# Patient Record
Sex: Male | Born: 1995 | Race: Black or African American | Hispanic: No | Marital: Single | Smoking: Never smoker
Health system: Southern US, Community
[De-identification: ages and names within clinical notes are randomized; demographics above are authoritative.]

---

## 2017-08-12 ENCOUNTER — Other Ambulatory Visit: Payer: Self-pay

## 2017-08-12 ENCOUNTER — Encounter (HOSPITAL_BASED_OUTPATIENT_CLINIC_OR_DEPARTMENT_OTHER): Payer: Self-pay | Admitting: *Deleted

## 2017-08-12 ENCOUNTER — Emergency Department (HOSPITAL_BASED_OUTPATIENT_CLINIC_OR_DEPARTMENT_OTHER)
Admission: EM | Admit: 2017-08-12 | Discharge: 2017-08-12 | Disposition: A | Discharge status: Home / Self Care | Payer: Managed Care, Other (non HMO) | Attending: Emergency Medicine | Admitting: Emergency Medicine

## 2017-08-12 ENCOUNTER — Emergency Department (HOSPITAL_BASED_OUTPATIENT_CLINIC_OR_DEPARTMENT_OTHER): Payer: Managed Care, Other (non HMO)

## 2017-08-12 DIAGNOSIS — M25572 Pain in left ankle and joints of left foot: Secondary | ICD-10-CM | POA: Diagnosis present

## 2017-08-12 MED ORDER — IBUPROFEN 800 MG PO TABS: 800.0000 mg | ORAL_TABLET | Freq: Three times a day (TID) | ORAL | 0 refills | Status: AC | PRN

## 2017-08-12 MED FILL — IBUPROFEN 800 MG TAB: 800 | 7 days supply | Qty: 21 | Fill #0

## 2017-08-12 NOTE — ED Provider Notes (Signed)
[facility] EMERGENCY DEPARTMENT Provider Note   CSN: [id number] Arrival date & time: 08/12/17  [id number]     History   Chief Complaint Chief Complaint  Patient presents with  . Ankle Pain    HPI Patrick Stark is a 22 y.o. male.  The history is provided by the patient.  Ankle Pain   The incident occurred 1 to 2 hours ago. The incident occurred at work. There was no injury mechanism. The pain is present in the left ankle. The quality of the pain is described as throbbing. The pain is at a severity of 2/10. The pain has been constant since onset. Pertinent negatives include no numbness, no inability to bear weight, no loss of motion, no muscle weakness, no loss of sensation and no tingling. The symptoms are aggravated by palpation and bearing weight. He has tried nothing for the symptoms. The treatment provided no relief.   22 year old male with prior history of left ankle fracture requiring surgery in 2014.  He states today he was just walking at work and he had acute pain in the lateral ankle.  There was no twist or fall.  He states this is a new problem and he has not had any difficulties with his ankle since the surgery.  Is not associated with any numbness or tingling or weakness.  It is worsened by ambulation and palpation of the area.  History reviewed. No pertinent past medical history.  There are no active problems to display for this patient.   History reviewed. No pertinent surgical history.     Home Medications    Prior to Admission medications   Not on File    Family History History reviewed. No pertinent family history.  Social History Social History   Tobacco Use  . Smoking status: Never Smoker  . Smokeless tobacco: Never Used  Substance Use Topics  . Alcohol use: Not on file  . Drug use: Not on file     Allergies   Patient has no known allergies.   Review of Systems Review of Systems  Constitutional: Negative for fever.  HENT:  Negative for sore throat.   Respiratory: Negative for shortness of breath.   Cardiovascular: Negative for chest pain.  Gastrointestinal: Negative for abdominal pain.  Genitourinary: Negative for dysuria.  Musculoskeletal: Positive for arthralgias (left ankle). Negative for back pain.  Skin: Negative for rash.  Neurological: Negative for tingling and numbness.     Physical Exam Updated Vital Signs BP 132/75 (BP Location: Left Arm)   Pulse 82   Temp 98.1 F (36.7 C) (Oral)   Resp 16   Ht 6\' 2"  (1.88 m)   Wt 72.6 kg (160 lb)   SpO2 100%   BMI 20.54 kg/m   Physical Exam  Constitutional: He appears well-developed and well-nourished.  HENT:  Head: Normocephalic and atraumatic.  Eyes: Conjunctivae are normal.  Neck: Neck supple.  Pulmonary/Chest: Effort normal.  Musculoskeletal:       Left knee: Normal.       Left ankle: He exhibits no swelling, no ecchymosis, no deformity and normal pulse. Tenderness. Lateral malleolus tenderness found. No medial malleolus, no head of 5th metatarsal and no proximal fibula tenderness found. Achilles tendon exhibits no pain and no defect.       Feet:  Neurological: He is alert. GCS eye subscore is 4. GCS verbal subscore is 5. GCS motor subscore is 6.  Skin: Skin is warm and dry.  Psychiatric: He has a normal mood and  affect.  Nursing note and vitals reviewed.    ED Treatments / Results  Labs (all labs ordered are listed, but only abnormal results are displayed) Labs Reviewed - No data to display  EKG  EKG Interpretation None       Radiology Dg Ankle Complete Left  Result Date: 08/12/2017 CLINICAL DATA:  Left lateral ankle pain starting this morning. Prior ankle surgery 4 years prior to imaging. EXAM: LEFT ANKLE COMPLETE - 3+ VIEW COMPARISON:  None. FINDINGS: Lateral plate and screw fixator of the distal fibula. Plafond and talar dome appear intact. No complicating feature regarding the fixation hardware. Flattening of the  longitudinal arch of the foot on the lateral projection, which was not obtained standing. No significant degree of soft tissue swelling is appreciated. Suspected small tibiotalar joint effusion. IMPRESSION: 1. Flattening of the longitudinal arch of the foot may be projectional or might reflect pes planus. Today's exam was not obtained with the patient standing. 2. No complicating feature related to the plate and screw fixators. 3. Suspected small tibiotalar joint effusion without appreciable lesion along the plafond or talar dome. Electronically Signed   By: Gaylyn Rong M.D.   On: 08/12/2017 08:58    Procedures Procedures (including critical care time)  Medications Ordered in ED Medications - No data to display   Initial Impression / Assessment and Plan / ED Course  I have reviewed the triage vital signs and the nursing notes.  Pertinent labs & imaging results that were available during my care of the patient were reviewed by me and considered in my medical decision making (see chart for details).     Differential diagnosis includes ankle sprain, ankle arthralgia secondary to loose foreign body, hardware failure.  By x-ray the hardware appears intact and there is no acute fracture.  Radiologist does comment upon a small effusion and a flattened longitudinal arch of the foot.  Feel the longitudinal arch flattening is not the source of the patient's discomfort today.  He likely had a loose body that somehow affected the joint and he is feeling the inflammatory pain since then.  We will treat him conservatively with anti-inflammatories.   Final Clinical Impressions(s) / ED Diagnoses   Final diagnoses:  Acute left ankle pain    ED Discharge Orders    None       Terrilee Files, MD 08/13/17 1221

## 2017-08-12 NOTE — ED Triage Notes (Signed)
Pt reports working 3am until 7am when he had a sudden onset of left ankle pain, denies injury or trauma, reports pain 1/10, increases to 2/10 with wb. Pt is watching tv, this rn has to ask him twice to turn off tv until after triage assessment complete. Pt states he has had surgery on this ankle. Socks removed, no swelling or redness noted. Scar to left outer ankle noted from previous surgery, pt is unsure if he has hardware.

## 2017-08-12 NOTE — Discharge Instructions (Addendum)
You were evaluated in the emergency department for left ankle pain.  Your x-ray did not show any acute fracture and your hardware was still in the correct position.  We are prescribing you ibuprofen to help with the inflammation and an ankle brace.  You should continue to ice the affected area.

## 2017-08-12 NOTE — ED Notes (Signed)
Patient transported to X-ray 

## 2019-08-15 IMAGING — CR DG ANKLE COMPLETE 3+V*L*
3 series · 3 of 3 positions shown · non-contrast
Comparison: None.

CLINICAL DATA: Left lateral ankle pain starting this morning. Prior
ankle surgery 4 years prior to imaging.

EXAM:
LEFT ANKLE COMPLETE - 3+ VIEW

[t ankle joint ap left]
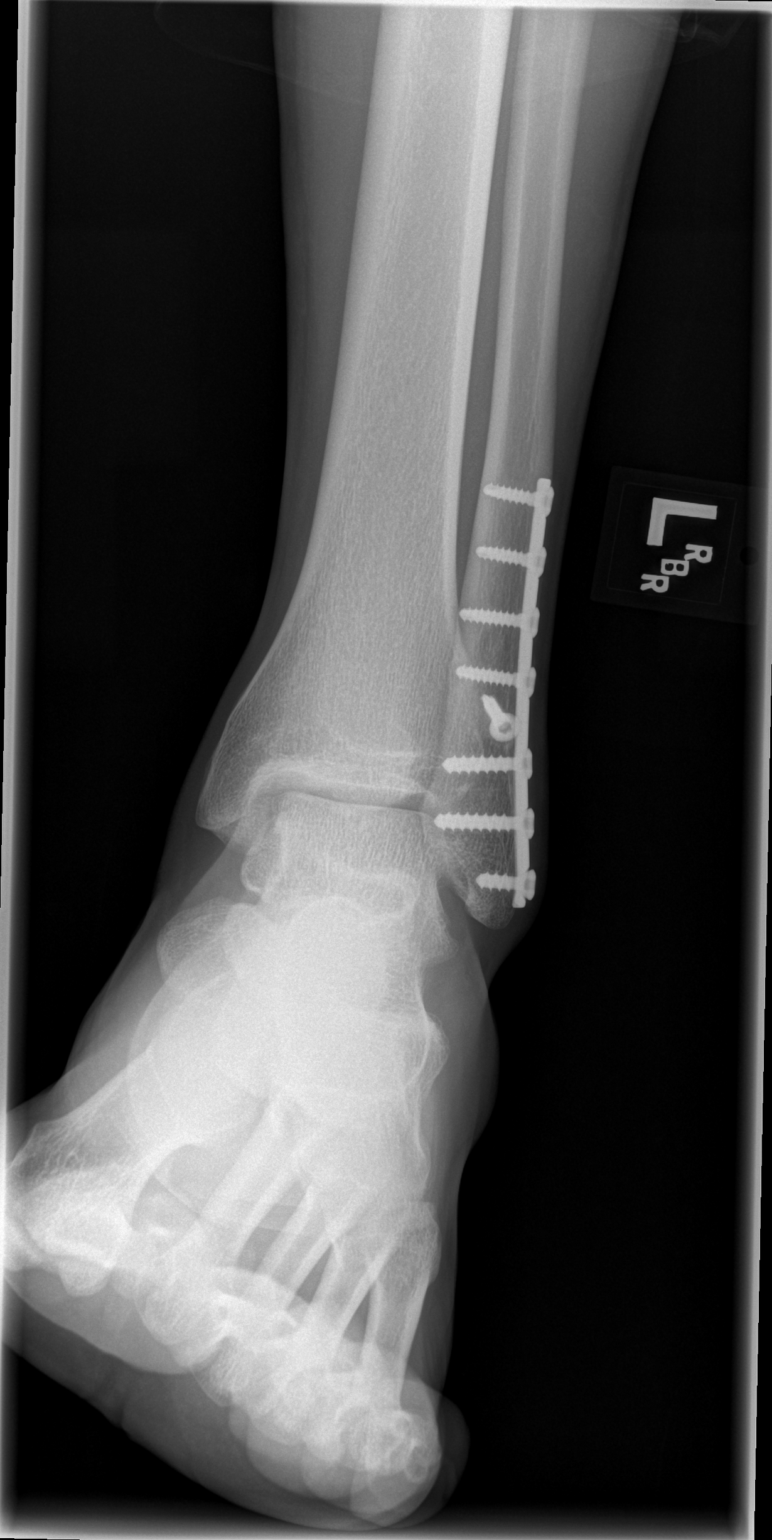

[t ankle joint oblique left]
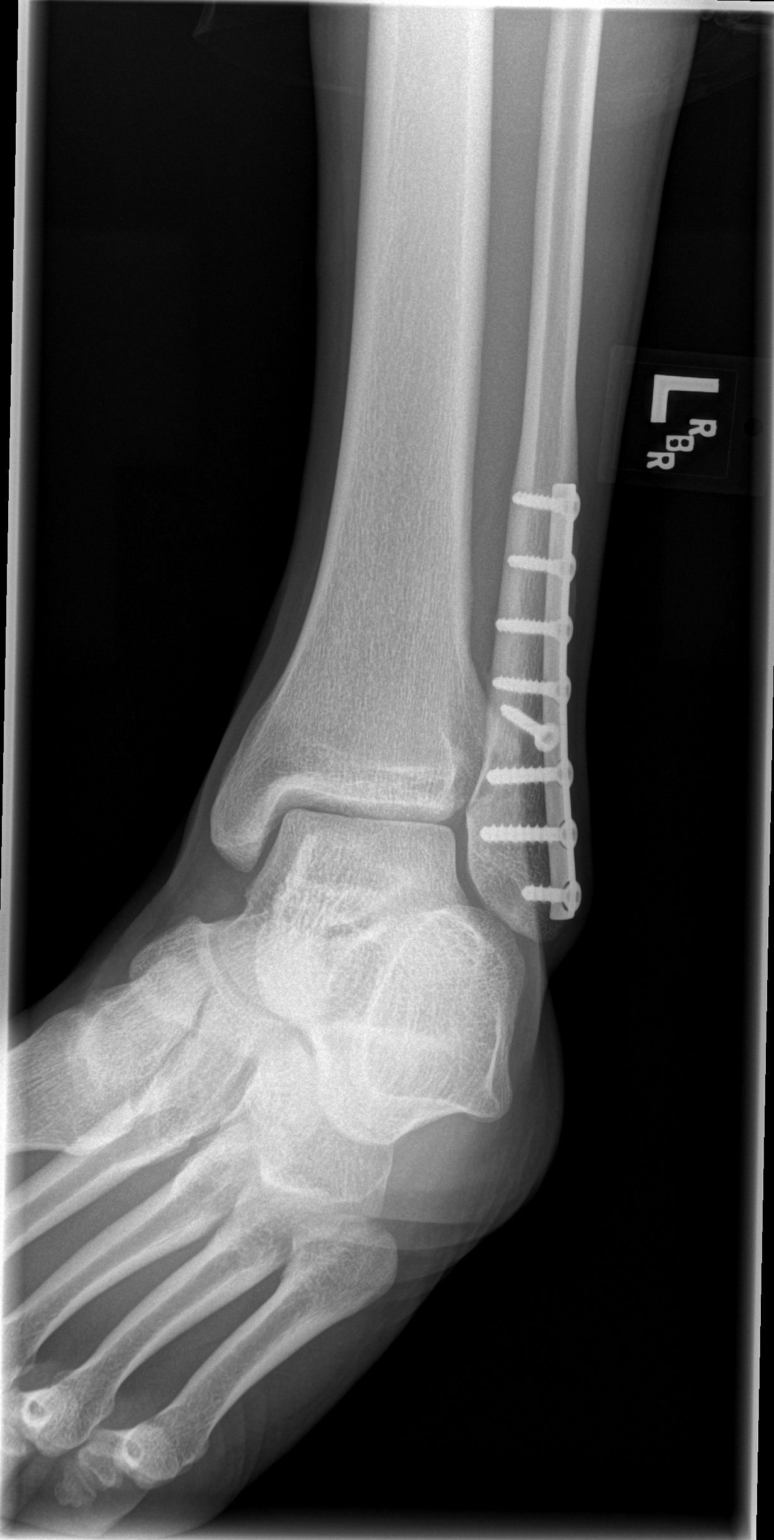

[t ankle joint lat left]
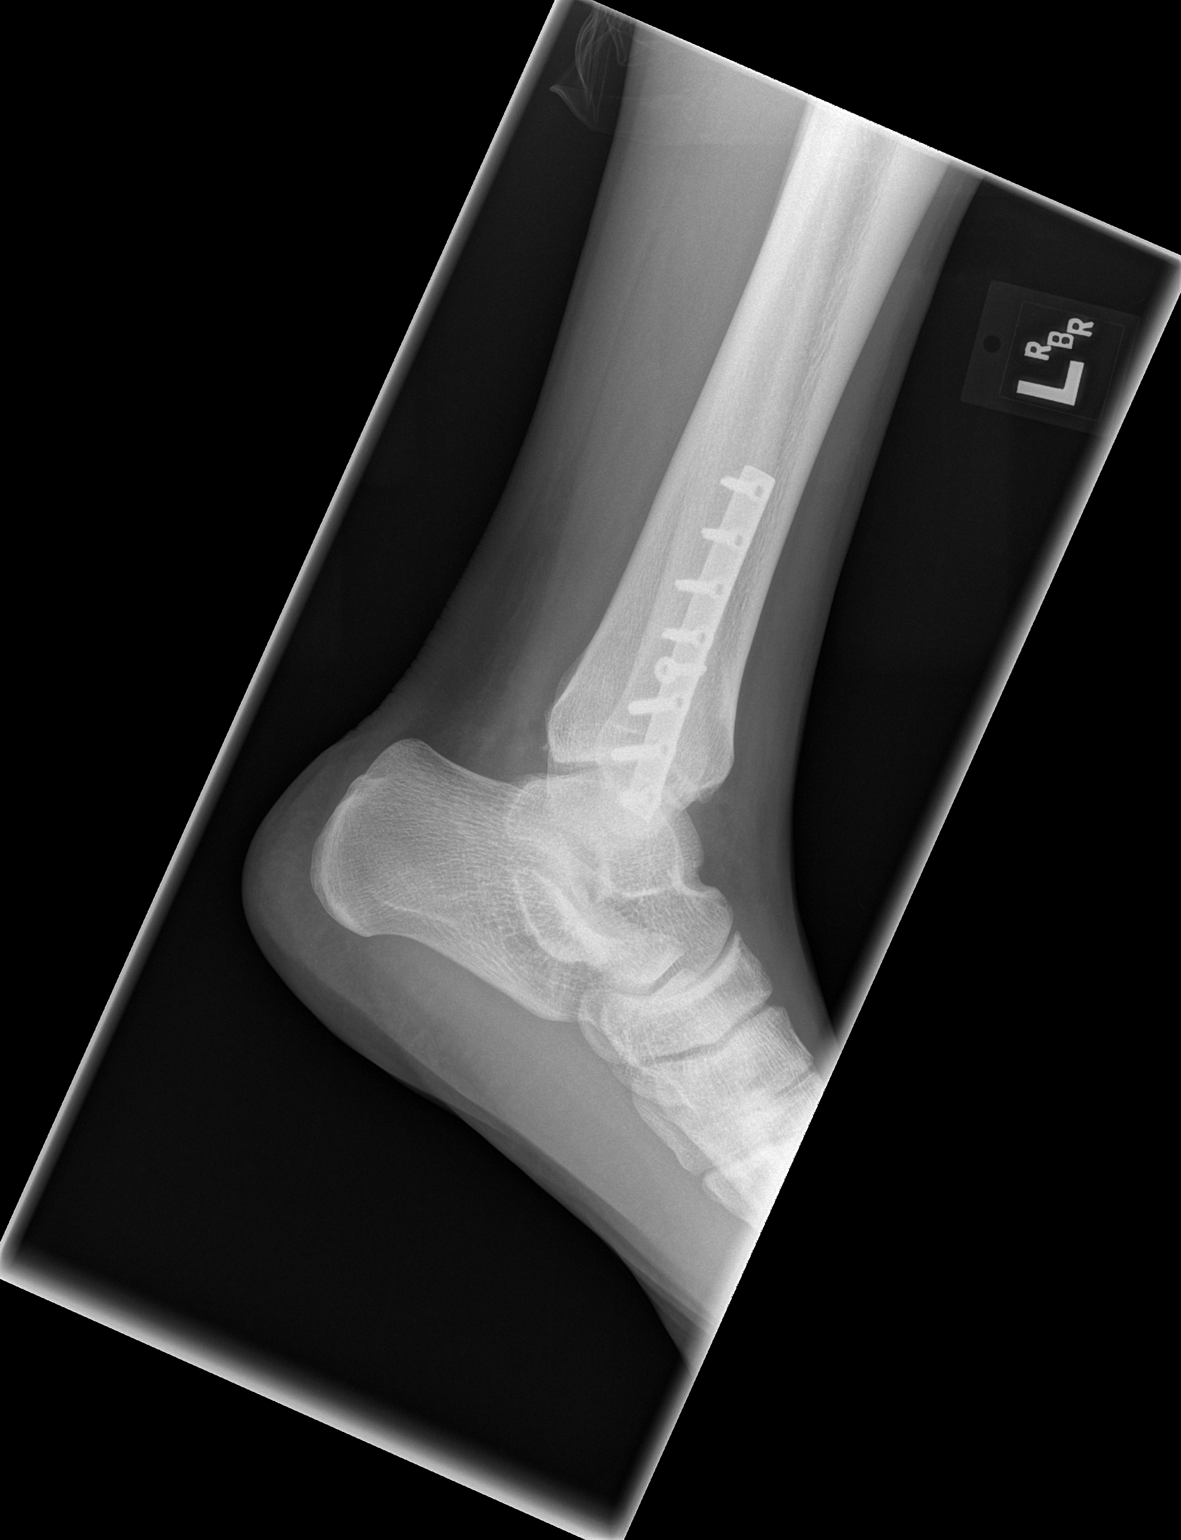

[3 of 3 positions shown; findings below may reference images not displayed]

FINDINGS: Lateral plate and screw fixator of the distal fibula.

Plafond and talar dome appear intact. No complicating feature
regarding the fixation hardware.

Flattening of the longitudinal arch of the foot on the lateral
projection, which was not obtained standing.

No significant degree of soft tissue swelling is appreciated.

Suspected small tibiotalar joint effusion.
IMPRESSION: 1. Flattening of the longitudinal arch of the foot may be
projectional or might reflect pes planus. Today's exam was not
obtained with the patient standing.
2. No complicating feature related to the plate and screw fixators.
3. Suspected small tibiotalar joint effusion without appreciable
lesion along the plafond or talar dome.

## 2021-05-30 ENCOUNTER — Other Ambulatory Visit: Payer: Self-pay | Admitting: Radiology
